# Patient Record
Sex: Male | Born: 1960 | Race: Black or African American | Hispanic: No | Marital: Married | Smoking: Former smoker
Health system: Southern US, Community
[De-identification: ages and names within clinical notes are randomized; demographics above are authoritative.]

## PROBLEM LIST (undated history)

## (undated) DIAGNOSIS — F1010 Alcohol abuse, uncomplicated: Secondary | ICD-10-CM

## (undated) DIAGNOSIS — E785 Hyperlipidemia, unspecified: Secondary | ICD-10-CM

## (undated) DIAGNOSIS — E119 Type 2 diabetes mellitus without complications: Secondary | ICD-10-CM

## (undated) DIAGNOSIS — I10 Essential (primary) hypertension: Secondary | ICD-10-CM

## (undated) HISTORY — DX: Essential (primary) hypertension: I10

## (undated) HISTORY — DX: Type 2 diabetes mellitus without complications: E11.9

## (undated) HISTORY — DX: Hyperlipidemia, unspecified: E78.5

## (undated) HISTORY — DX: Alcohol abuse, uncomplicated: F10.10

---

## 2005-04-09 ENCOUNTER — Encounter: Admission: RE | Admit: 2005-04-09 | Discharge: 2005-04-09 | Payer: Self-pay | Admitting: Occupational Medicine

## 2006-11-29 ENCOUNTER — Ambulatory Visit: Payer: Self-pay | Admitting: Cardiology

## 2006-11-29 ENCOUNTER — Ambulatory Visit (HOSPITAL_COMMUNITY): Admission: EM | Admit: 2006-11-29 | Discharge: 2006-11-30 | Payer: Self-pay | Admitting: Emergency Medicine

## 2006-11-30 ENCOUNTER — Encounter: Payer: Self-pay | Admitting: Cardiology

## 2009-09-12 ENCOUNTER — Encounter: Admission: RE | Admit: 2009-09-12 | Discharge: 2009-09-12 | Payer: Self-pay | Admitting: Internal Medicine

## 2010-09-01 IMAGING — CR DG KNEE COMPLETE 4+V*L*
5 series · 5 of 5 positions shown · non-contrast
Comparison: None

CLINICAL DATA: Injury

LEFT KNEE - COMPLETE 4+ VIEW

[view not recorded (1 of 5)]
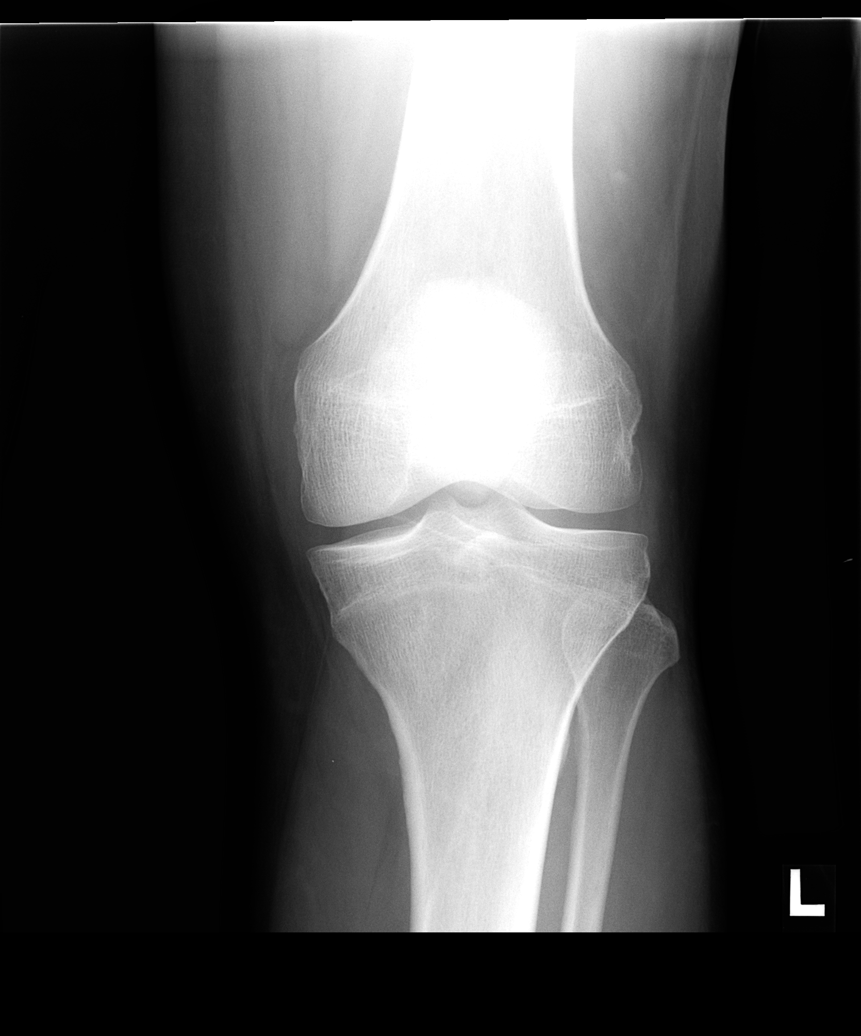

[view not recorded (2 of 5)]
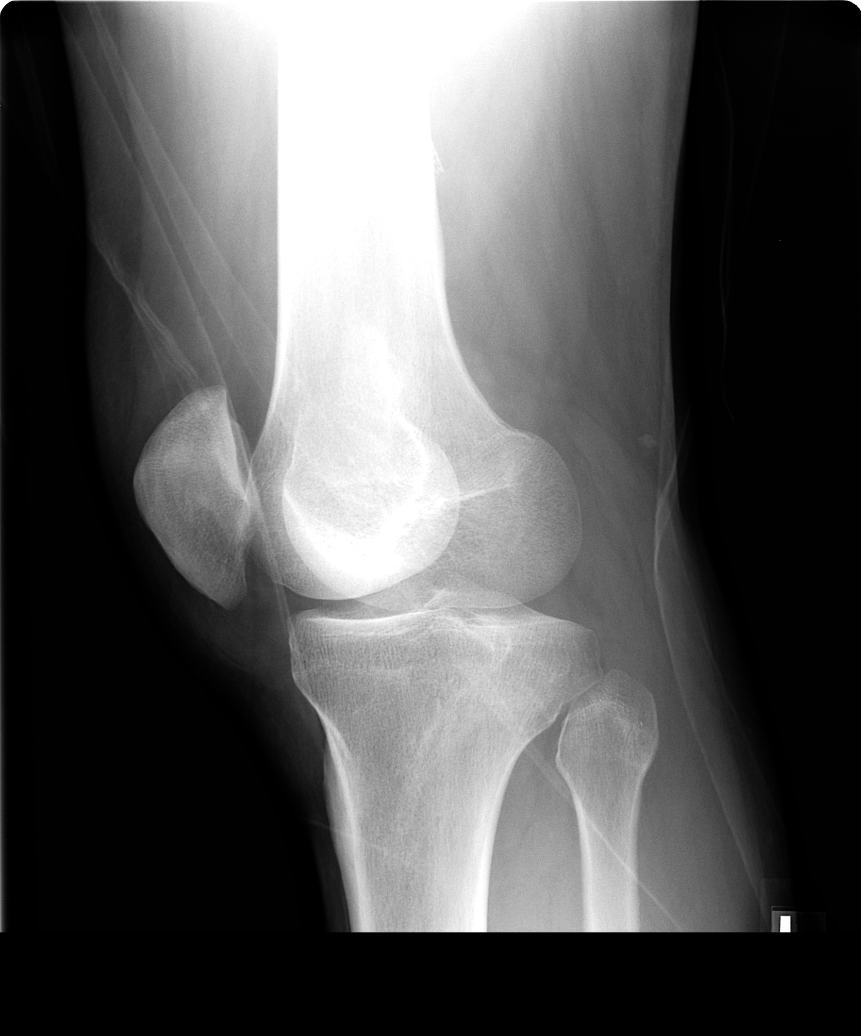

[view not recorded (3 of 5)]
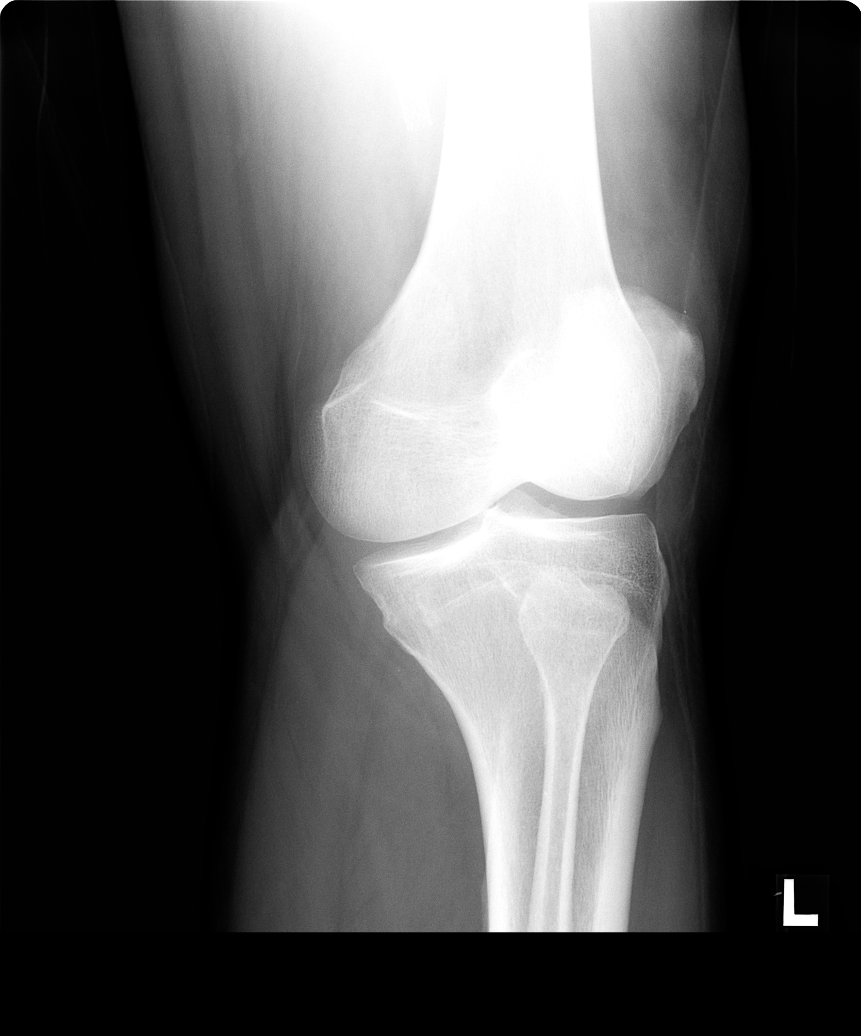

[view not recorded (4 of 5)]
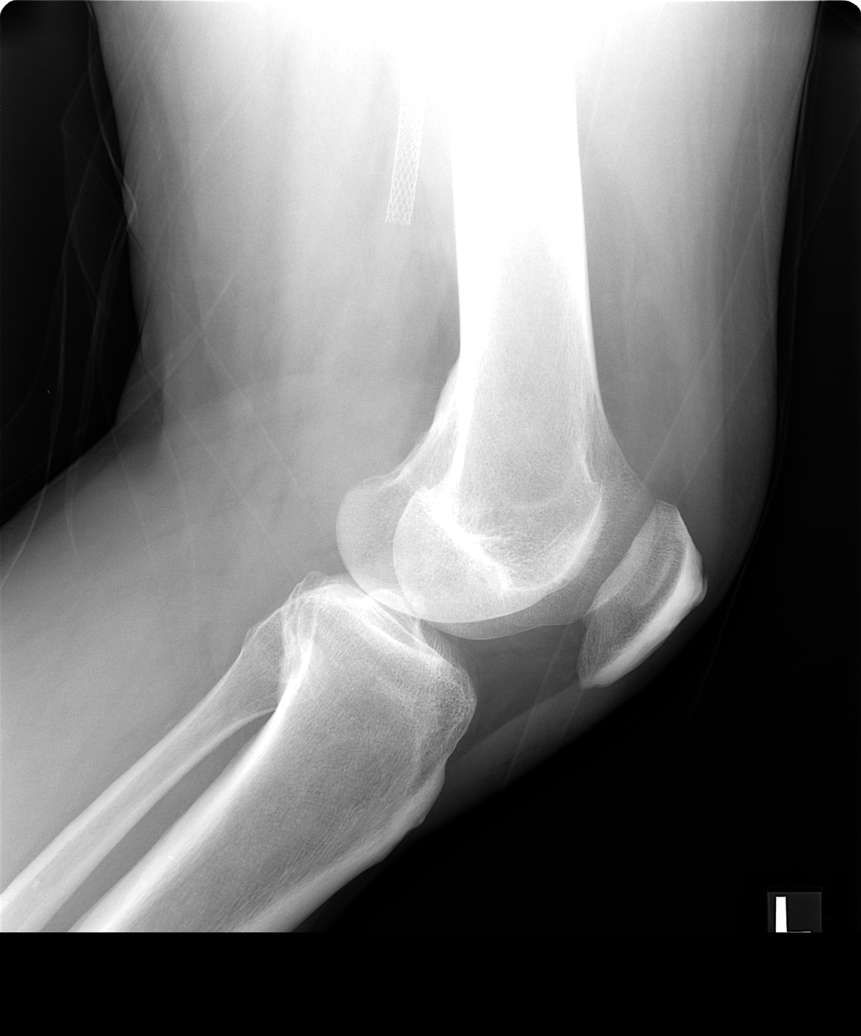

[view not recorded (5 of 5)]
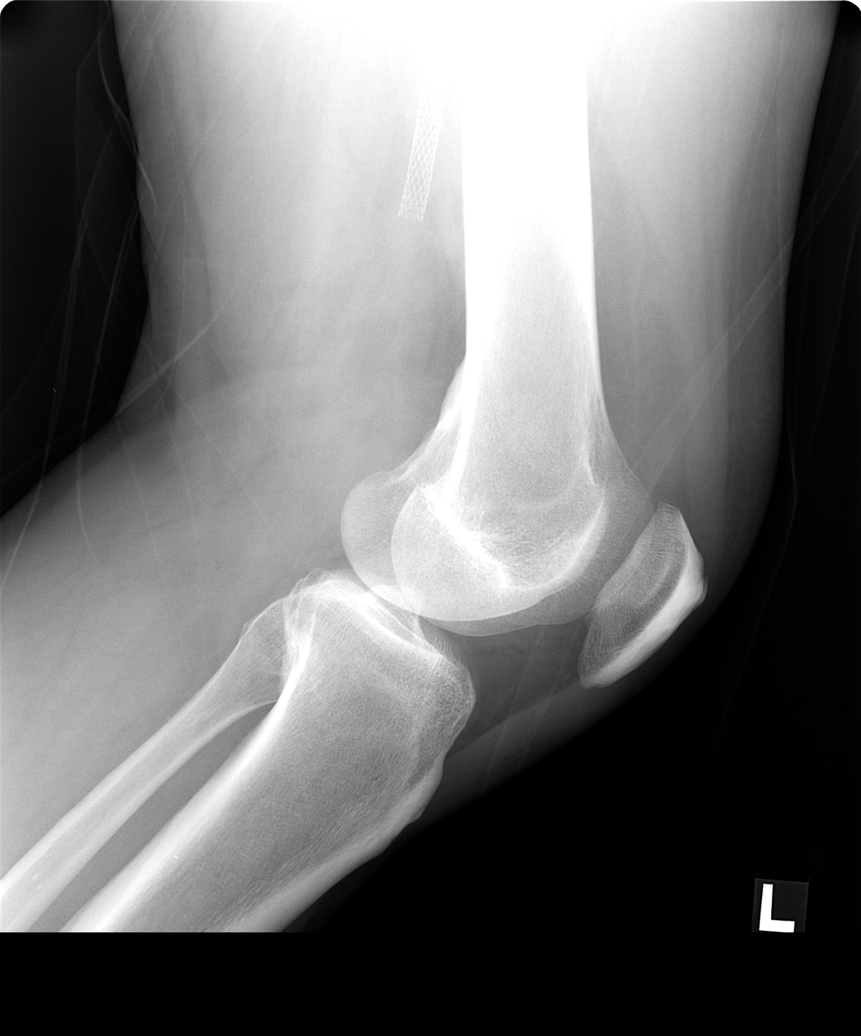

[5 of 5 positions shown; findings below may reference images not displayed]

FINDINGS: No acute fracture.  No dislocation.  Minimal degenerative
change.  Popliteal artery stent.
IMPRESSION: No acute bony pathology.

## 2011-03-02 NOTE — Discharge Summary (Signed)
Carlos Delgado, BAGOT             ACCOUNT NO.:  [id number]   MEDICAL RECORD NO.:  [id number]          PATIENT TYPE:  OIB   LOCATION:  3729                         FACILITY:  [facility]   PHYSICIAN:  Dorian Pod, ACNP  DATE OF BIRTH:  11/19/1960   DATE OF ADMISSION:  11/29/2006  DATE OF DISCHARGE:  11/30/2006                               DISCHARGE SUMMARY   DISCHARGING PHYSICIAN:  Luis Abed, MD, [facility].   DISCHARGING DIAGNOSIS:  1. Chest pain, most likely etiology pericarditis.  The patient is past      post 2D echocardiogram this admission shows a normal EF of 65-70%.      No left ventricular regional wall motion abnormalities.  Normal      mitral valve.  Atrial valve thickness mildly increased.  No      pericardial effusion.  Cardiac markers:  Troponin was in normal      limits however, CK total elevated at 396.  2. Hypertension.  3. Hyperlipidemia, new diagnosis.  4. Total cholesterol 173, triglycerides 249, LDL 85, HDL 38.  5. Tobacco use.   HOSPITAL COURSE:  Mr. Aigner is pleasant 50 year old African American  gentleman with no prior history of coronary artery disease.  Risk  factors:  Hypertension, tobacco, age, sex.  The patient was in his usual  state of health until day of admission, while getting ready for work,  began having some left chest squeezing sensation that lasted 1-2 seconds  and decreased spontaneously however, became more frequent while at work.  Worse with inspiration and deep breath.  The patient went to Urgent Care  where EKG showed diffuse 1 mm ST elevations which were felt to be  significant for early repolarization versus pericarditis when patient  arrived at [facility].  The patient was admitted for observation.  Cycle  cardiac enzymes, troponins, negative x3, however with elevated CK total.  Dr. Myrtis Ser in to see patient on day of discharge.  Heart rate 53, blood  pressure 124/69.  The patient is being discharged home with instructions  to followup  with Dr. Excell Seltzer for reevaluation.   MEDICATIONS AT TIME OF DISCHARGE:  1. Patient's previous blood pressure medication which he had stopped      himself.  2. Lisinopril/hydrochlorothiazide 20/12.5 mg daily.  3. Aspirin 81 mg daily.  4. Simvastatin 40 mg daily are new.  5. Ibuprofen 600 mg q.6 h for the next 4 days.   He can return to work on Tuesday, December 02, 2006.  He needs to  followup with Dr. Excell Seltzer within the next 2 weeks.  The patient agrees to  call our office to schedule an appointment, otherwise increase activity  as tolerated.  Seek medical assistance if he has developing fever,  chills, return of chest pain or increased shortness of breath.      Dorian Pod, ACNP     MB/MEDQ  D:  11/30/2006  T:  12/01/2006  Job:  [id number]   cc:   Zollie Pee, MD  [place] Cleta Alberts, M.D.

## 2011-03-02 NOTE — H&P (Signed)
NAMEBANNER, HUCKABA             ACCOUNT NO.:  [id number]   MEDICAL RECORD NO.:  [id number]          PATIENT TYPE:  OIB   LOCATION:  3729                         FACILITY:  [facility]   PHYSICIAN:  Gerrit Friends. Dietrich Pates, MD, FACCDATE OF BIRTH:  06-27-1961   DATE OF ADMISSION:  11/29/2006  DATE OF DISCHARGE:  11/30/2006                              HISTORY & PHYSICAL   PRIMARY CARE PHYSICIAN:  Dr. Zollie Pee in [place]   REFERRING PHYSICIAN:  Dr. Cleta Alberts at urgent care in [place].  He is new  to [facility].   PATIENT PROFILE:  A 50 year old, married African-American male without  prior history of coronary artery disease who presents to the ED with  chest pain.   PROBLEM LIST:  1. Chest pain.  2. Hypertension.  3. Untreated hyperlipidemia.  4. Ongoing tobacco abuse.   HISTORY OF PRESENT ILLNESS:  This is a 50 year old, married African-  American male without prior history of CAD.  He does have a history of  hypertension, tobacco abuse and non-treated hyperlipidemia.  He was in  his usual state of health until this morning at approximately 5:30 a.m.  when getting ready for work, he developed 6/10 left chest squeezing pain  without radiation or associated symptoms lasting one to two seconds then  resolving spontaneously.  The symptoms were more frequent at work with  exertion, but were also noted to worsen with deep breathing and again  lasted only one or two seconds.  At work, symptoms would occur as much  as 15 times an hour, thus, prompting him to call his primary care  physician in [facility] who recommended that he presented to the ED.  Instead, he went to a local urgent care and was seen by Dr. Cleta Alberts.  ECG  there revealed 0.5 to 1 mm ST-segment elevation in leads 1, 2, AVF and  V3-V6 and he was subsequently sent along to the [facility] ED for  further evaluation.  Here, he is currently pain free at rest, however he  does have reproducible left chest pain with deep  breathing.  He  continues to have ECG changes as described above.   ALLERGIES:  No known drug allergies.   HOME MEDICATIONS:  Lisinopril/HCTZ 20/12.5 mg q.d.  He was previously on  a statin which he self-discontinued.   FAMILY HISTORY:  Mother died of pneumonia and alcoholism at age 53,  father is age 77 and alive and well, he has had two sisters and one  brother who are alive and well.   SOCIAL HISTORY:  He lives in [place] with his wife and one of his  children, he also has four step-children who are grown.  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  He  smoked about 3/4 of a pack a day for the past 25 years and continues to  smoke that amount.  He previously was a heavy alcohol user, but quit six  years ago.  He also previously used crack, marijuana, heroin and also  injected heroin on one occasion.  He quit  all drugs six years ago.   REVIEW OF SYSTEMS:  Positive for pleuritic chest pain as described in  the HPI, otherwise all systems reviewed and are negative.   PHYSICAL EXAMINATION:  VITAL SIGNS:  Temperature 97.6, heart rate 56,  respirations 16, blood pressure 138/74, pulse ox 99% on room air, there  was no pulsus paradoxus.  GENERAL:  Pleasant African-American male in no acute distress, awake,  alert and oriented x3.  NECK:  No bruits or JVD.  LUNGS:  Respirations are regular and unlabored with diminished breath  sounds at the bases and otherwise clear to auscultation.  CARDIAC:  Regular S1-S2, no S3-S4, murmurs or rubs.  ABDOMEN:  Round, soft, nontender, nondistended.  Bowel sounds are  present x4.  EXTREMITIES:  Warm and dry.  No clubbing, cyanosis or edema.  Dorsalis  pedis, posterior tibial pulses 2+ and equal bilaterally.   Chest x-ray is pending.   EKG shows sinus rhythm with a normal axis, rate of 56 beats per minute  and 0.5 to 1 mm ST-segment elevation with J-point elevation in leads 1,  2, AVF and V3-V6.   CK-MB  is 1.9, troponin I is less than 0.05.  Other lab work is pending.   ASSESSMENT/PLAN:  1. Chest pain.  He has both typical (exertional) and atypical      (pleuritic, brief lasting one to two seconds) symptoms.  He is now      pain free except for when he takes a deep breath which reproduces      his pain exactly.  ECG reveals 0.5 to 1 mm fairly diffuse ST-      segment elevation as described above, question early repolarization      versus pericarditis given the pleur tic nature of his discomfort.      I will plan to  observe him tonight, will cycle his cardiac      markers, check 2-D echo to evaluate the ejection fraction, wall      motion and his pericardium.  Provided that enzymes and echo are      normal, probable discharge in the morning.  Giving the atypical      nature of his symptoms, he likely does not require further ischemic      evaluation.  We will check a D-dimer.  2. Hypertension.  Stable, continue home medication.  3. Hyperlipidemia.  This is currently untreated.  He was previously on      a statin, but however came off of it apparently because of cost.      He said his primary care doctor was going to write for a generic      available at [facility], but he has not followed up with him      recently.  We will go ahead and add a statin and check lipids and      LFTs.  4. Tobacco abuse.  Smoking cessation strongly advised.  We will ask      the cessation team to see him.      Nicolasa Ducking, ANP      Gerrit Friends. Dietrich Pates, MD, [facility]  Electronically Signed    CB/MEDQ  D:  11/29/2006  T:  11/30/2006  Job:  [id number]

## 2012-12-31 ENCOUNTER — Encounter: Payer: Self-pay | Admitting: Family Medicine

## 2012-12-31 ENCOUNTER — Ambulatory Visit (INDEPENDENT_AMBULATORY_CARE_PROVIDER_SITE_OTHER): Payer: BC Managed Care – PPO | Admitting: Family Medicine

## 2012-12-31 VITALS — BP 132/79 | HR 68 | Ht 68.5 in | Wt 177.0 lb

## 2012-12-31 DIAGNOSIS — I10 Essential (primary) hypertension: Secondary | ICD-10-CM | POA: Insufficient documentation

## 2012-12-31 DIAGNOSIS — E119 Type 2 diabetes mellitus without complications: Secondary | ICD-10-CM | POA: Insufficient documentation

## 2012-12-31 DIAGNOSIS — I739 Peripheral vascular disease, unspecified: Secondary | ICD-10-CM | POA: Insufficient documentation

## 2012-12-31 DIAGNOSIS — E785 Hyperlipidemia, unspecified: Secondary | ICD-10-CM | POA: Insufficient documentation

## 2012-12-31 DIAGNOSIS — K047 Periapical abscess without sinus: Secondary | ICD-10-CM

## 2012-12-31 MED ORDER — AMOXICILLIN-POT CLAVULANATE 500-125 MG PO TABS: ORAL_TABLET | ORAL | Status: AC

## 2012-12-31 MED ORDER — HYDROCODONE-ACETAMINOPHEN 5-325 MG PO TABS: ORAL_TABLET | ORAL | Status: AC
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
## 2013-02-26 ENCOUNTER — Encounter: Payer: Self-pay | Admitting: Physician Assistant

## 2013-02-26 DIAGNOSIS — K219 Gastro-esophageal reflux disease without esophagitis: Secondary | ICD-10-CM | POA: Insufficient documentation

## 2016-01-23 DIAGNOSIS — F99 Mental disorder, not otherwise specified: Secondary | ICD-10-CM

## 2016-01-23 NOTE — Congregational Nurse Program (Signed)
Congregational Nurse Program Note  Date of Encounter: 01/23/2016  Past Medical History: Past Medical History  Diagnosis Date  . Hypertension   . Diabetes   . Hyperlipidemia   . Alcohol abuse     Encounter Details:     CNP Questionnaire - 01/23/16 1714    Patient Demographics   Is this a new or existing patient? Existing   Patient is considered a/an Not Applicable   Race African-American/Black   Patient Assistance   Location of Patient Assistance Not Applicable   Patient's financial/insurance status Self-Pay;Private Insurance Coverage   Uninsured Patient No   Food insecurities addressed Not Applicable   Transportation assistance No   Assistance securing medications No   Educational health offerings Behavioral health;Medications   Encounter Details   Primary purpose of visit Education/Health Concerns   Was an Emergency Department visit averted? No   Does patient have a medical provider? Yes   Patient referred to Follow up with established PCP   Was a mental health screening completed? (GAINS tool) No   Does patient have dental issues? No   Does patient have vision issues? No   Since previous encounter, have you referred patient for abnormal blood pressure that resulted in a new diagnosis or medication change? No   Since previous encounter, have you referred patient for abnormal blood glucose that resulted in a new diagnosis or medication change? No   For Abstraction Use Only   Does patient have insurance? Yes       S- CN met with Carlos Delgado at [facility] today  O - Carlos Delgado Had some concerns about his increase in mental health medications  A- Carlos Delgado reported an increase in his medication and is not sure he really needs it at this time .\ P- CN encouraged Carlos. Delgado to speak with his psychiatrist about his concerns and report any side affects .

## 2016-01-25 DIAGNOSIS — Z0130 Encounter for examination of blood pressure without abnormal findings: Secondary | ICD-10-CM

## 2016-01-25 NOTE — Congregational Nurse Program (Signed)
Congregational Nurse Program Note  Date of Encounter: 01/25/2016  Past Medical History: Past Medical History  Diagnosis Date  . Hypertension   . Diabetes   . Hyperlipidemia   . Alcohol abuse     Encounter Details:     CNP Questionnaire - 01/25/16 1944    Patient Demographics   Is this a new or existing patient? Existing   Patient is considered a/an Not Applicable   Race African-American/Black   Patient Assistance   Location of Patient Assistance Not Applicable   Patient's financial/insurance status Private Insurance Coverage   Uninsured Patient No   Patient referred to apply for the following financial assistance Not Applicable   Food insecurities addressed Not Applicable   Transportation assistance No   Assistance securing medications No   Educational health offerings Hypertension   Encounter Details   Primary purpose of visit Education/Health Concerns   Was an Emergency Department visit averted? No   Does patient have a medical provider? Yes   Patient referred to Follow up with established PCP   Was a mental health screening completed? (GAINS tool) No   Does patient have dental issues? No   Does patient have vision issues? No   Since previous encounter, have you referred patient for abnormal blood pressure that resulted in a new diagnosis or medication change? No   Since previous encounter, have you referred patient for abnormal blood glucose that resulted in a new diagnosis or medication change? No   For Abstraction Use Only   Does patient have insurance? No       S- Carlos Delgado [facility] blood [facility]  O-Blood pressure reading 141/70 A- Carlos Delgado has a normal blood pressure reading  P Carlos Delgado asked about what was a normal blood pressure reading 120/70,explained that a blood pressure under 140/90 is usually normal depending on the persons diet and activity when taken

## 2016-04-21 DIAGNOSIS — R079 Chest pain, unspecified: Secondary | ICD-10-CM

## 2016-04-21 NOTE — Congregational Nurse Program (Unsigned)
Congregational Nurse Program Note  Date of Encounter: 04/21/2016  Past Medical History: Past Medical History  Diagnosis Date  . Hypertension   . Diabetes   . Hyperlipidemia   . Alcohol abuse     Encounter Details:     CNP Questionnaire - 04/21/16 1239    Patient Demographics   Is this a new or existing patient? Existing   Patient is considered a/an Not Applicable   Race African-American/Black   Patient Assistance   Location of Patient Assistance Not Applicable   Patient's financial/insurance status Private Insurance Coverage   Uninsured Patient No   Patient referred to apply for the following financial assistance Not Applicable   Food insecurities addressed Not Applicable   Transportation assistance No   Assistance securing medications No   Educational health offerings Not Applicable   Encounter Details   Primary purpose of visit Post ED/Hospitalization Visit   Was an Emergency Department visit averted? No   Does patient have a medical provider? Yes   Patient referred to Follow up with established PCP   Was a mental health screening completed? (GAINS tool) No   Does patient have dental issues? No   Does patient have vision issues? No   Does your patient have an abnormal blood pressure today? No   Does your patient have an abnormal blood glucose today? No   Since previous encounter, have you referred patient for abnormal blood glucose that resulted in a new diagnosis or medication change? No   Was there a life-saving intervention made? No       CN note  S- Mr Carlos Delgado was seen at [facility] by the CN O- Mr .Carlos Delgado was discharged from the hospital after 24hrs  being admitted for chest pain  A- Mr Carlos Delgado was pain free today  P- Mr Carlos Delgado has an appointment for follow up with a cardiologist

## 2017-02-03 ENCOUNTER — Emergency Department (HOSPITAL_COMMUNITY)
Admission: EM | Admit: 2017-02-03 | Discharge: 2017-02-03 | Disposition: A | Discharge status: Home / Self Care | Payer: Self-pay | Attending: Dermatology | Admitting: Dermatology

## 2017-02-03 ENCOUNTER — Encounter (HOSPITAL_COMMUNITY): Payer: Self-pay | Admitting: Emergency Medicine

## 2017-02-03 DIAGNOSIS — Z5321 Procedure and treatment not carried out due to patient leaving prior to being seen by health care provider: Secondary | ICD-10-CM | POA: Insufficient documentation

## 2017-02-03 DIAGNOSIS — R1110 Vomiting, unspecified: Secondary | ICD-10-CM | POA: Insufficient documentation

## 2017-02-03 MED ORDER — ONDANSETRON 4 MG PO TBDP
4.0000 mg | ORAL_TABLET | Freq: Once | ORAL | Status: AC | PRN
  Administered 2017-02-03: 4 mg via ORAL
  Filled 2017-02-03: qty 1

## 2017-02-03 NOTE — ED Triage Notes (Signed)
Patient states that he was seen here on Friday for same vomiting and left here yesterday feeling better then last night vomiting started again.  Patient c/o abd pain but denies diarrhea.

## 2021-12-03 ENCOUNTER — Emergency Department (HOSPITAL_COMMUNITY)
Admission: EM | Admit: 2021-12-03 | Discharge: 2021-12-03 | Disposition: A | Discharge status: Home / Self Care | Payer: BC Managed Care – PPO | Attending: Emergency Medicine | Admitting: Emergency Medicine

## 2021-12-03 ENCOUNTER — Other Ambulatory Visit: Payer: Self-pay

## 2021-12-03 ENCOUNTER — Encounter (HOSPITAL_COMMUNITY): Payer: Self-pay

## 2021-12-03 DIAGNOSIS — R42 Dizziness and giddiness: Secondary | ICD-10-CM | POA: Insufficient documentation

## 2021-12-03 DIAGNOSIS — Z7982 Long term (current) use of aspirin: Secondary | ICD-10-CM | POA: Insufficient documentation

## 2021-12-03 DIAGNOSIS — E876 Hypokalemia: Secondary | ICD-10-CM | POA: Diagnosis not present

## 2021-12-03 DIAGNOSIS — I10 Essential (primary) hypertension: Secondary | ICD-10-CM

## 2021-12-03 DIAGNOSIS — Z7901 Long term (current) use of anticoagulants: Secondary | ICD-10-CM | POA: Diagnosis not present

## 2021-12-03 LAB — CBC WITH DIFFERENTIAL/PLATELET
Abs Immature Granulocytes: 0.02 10*3/uL (ref 0.00–0.07)
Basophils Absolute: 0 10*3/uL (ref 0.0–0.1)
Basophils Relative: 0 %
Eosinophils Absolute: 0.2 10*3/uL (ref 0.0–0.5)
Eosinophils Relative: 2 %
HCT: 40.6 % (ref 39.0–52.0)
Hemoglobin: 14.1 g/dL (ref 13.0–17.0)
Immature Granulocytes: 0 %
Lymphocytes Relative: 25 %
Lymphs Abs: 1.8 10*3/uL (ref 0.7–4.0)
MCH: 28.2 pg (ref 26.0–34.0)
MCHC: 34.7 g/dL (ref 30.0–36.0)
MCV: 81.2 fL (ref 80.0–100.0)
Monocytes Absolute: 0.7 10*3/uL (ref 0.1–1.0)
Monocytes Relative: 10 %
Neutro Abs: 4.4 10*3/uL (ref 1.7–7.7)
Neutrophils Relative %: 63 %
Platelets: 248 10*3/uL (ref 150–400)
RBC: 5 MIL/uL (ref 4.22–5.81)
RDW: 13.8 % (ref 11.5–15.5)
WBC: 7.1 10*3/uL (ref 4.0–10.5)
nRBC: 0 % (ref 0.0–0.2)

## 2021-12-03 LAB — BASIC METABOLIC PANEL
Anion gap: 7 (ref 5–15)
BUN: 14 mg/dL (ref 8–23)
CO2: 27 mmol/L (ref 22–32)
Calcium: 8.9 mg/dL (ref 8.9–10.3)
Chloride: 102 mmol/L (ref 98–111)
Creatinine, Ser: 1.42 mg/dL — ABNORMAL HIGH (ref 0.61–1.24)
GFR, Estimated: 56 mL/min — ABNORMAL LOW (ref >60)
Glucose, Bld: 121 mg/dL — ABNORMAL HIGH (ref 70–99)
Potassium: 3.2 mmol/L — ABNORMAL LOW (ref 3.5–5.1)
Sodium: 136 mmol/L (ref 135–145)

## 2021-12-03 MED ORDER — POTASSIUM CHLORIDE CRYS ER 20 MEQ PO TBCR
40.0000 meq | EXTENDED_RELEASE_TABLET | Freq: Once | ORAL | Status: AC
  Administered 2021-12-03: 40 meq via ORAL
  Filled 2021-12-03: qty 2

## 2021-12-03 NOTE — ED Triage Notes (Signed)
Patient reports that his BP at home was 213/97. Patient states he had not been taking his BP meds as directed until he went to his physician last week. Patient states his BP has stayed high for the week. Patient states dizziness started this AM.

## 2021-12-03 NOTE — ED Provider Notes (Signed)
[facility]-EMERGENCY DEPT Provider Note   CSN: [id number] Arrival date & time: 12/03/21  0908     History  Chief Complaint  Patient presents with   Hypertension   Dizziness    Carlos Delgado is a 61 y.o. male.  61 year old male with prior medical history detailed below presents for evaluation.  Patient with known history of hypertension.  Patient reports compliance with previously prescribed antihypertensive.  Patient reports that over the last week he has noticed his blood pressure has been elevated at home.  Home blood pressure reads of been as high as 200/100.  Patient denies chest pain or shortness of breath.  Patient is concerned that his blood pressure is remaining elevated despite taking his home medications.  Patient without other acute complaint.  The history is provided by the patient and medical records.  Hypertension This is a chronic problem. The current episode started more than 1 week ago. The problem occurs daily. The problem has not changed since onset.Pertinent negatives include no chest pain and no abdominal pain. Nothing aggravates the symptoms. Nothing relieves the symptoms.  Dizziness Associated symptoms: no chest pain       Home Medications Prior to Admission medications   Medication Sig Start Date End Date Taking? Authorizing Provider  aspirin 81 MG tablet Take 81 mg by mouth daily.   Yes [provider]  cilostazol (PLETAL) 50 MG tablet Take 100 mg by mouth 2 (two) times daily. 11/17/21  Yes [provider]  clopidogrel (PLAVIX) 75 MG tablet Take 75 mg by mouth daily. 05/10/15  Yes [provider]  gabapentin (NEURONTIN) 300 MG capsule Take 600 mg by mouth in the morning and at bedtime. 05/11/15  Yes [provider]  lisinopril-hydrochlorothiazide (ZESTORETIC) 20-25 MG tablet Take 1 tablet by mouth daily. 09/22/21  Yes [provider]  Multiple Vitamin (MULTIVITAMIN) tablet Take 1 tablet by  mouth daily.   Yes [provider]  HYDROcodone-acetaminophen (NORCO/VICODIN) 5-325 MG per tablet One to two tablets every eight hours only as needed for severe tooth pain. Patient not taking: Reported on 12/03/2021 12/31/12   Laren Boom, DO      Allergies    Fluoxetine and Simvastatin    Review of Systems   Review of Systems  Cardiovascular:  Negative for chest pain.  Gastrointestinal:  Negative for abdominal pain.  Neurological:  Positive for dizziness.  All other systems reviewed and are negative.  Physical Exam Updated Vital Signs BP (!) 165/95    Pulse (!) 54    Temp (!) 97.5 F (36.4 C) (Oral)    Resp 15    Ht 5\' 8"  (1.727 m)    Wt 90.7 kg    SpO2 96%    BMI 30.41 kg/m  Physical Exam Vitals and nursing note reviewed.  Constitutional:      General: He is not in acute distress.    Appearance: Normal appearance. He is well-developed.  HENT:     Head: Normocephalic and atraumatic.  Eyes:     Conjunctiva/sclera: Conjunctivae normal.     Pupils: Pupils are equal, round, and reactive to light.  Cardiovascular:     Rate and Rhythm: Normal rate and regular rhythm.     Heart sounds: Normal heart sounds.  Pulmonary:     Effort: Pulmonary effort is normal. No respiratory distress.     Breath sounds: Normal breath sounds.  Abdominal:     General: There is no distension.     Palpations: Abdomen is  soft.     Tenderness: There is no abdominal tenderness.  Musculoskeletal:        General: No deformity. Normal range of motion.     Cervical back: Normal range of motion and neck supple.  Skin:    General: Skin is warm and dry.  Neurological:     General: No focal deficit present.     Mental Status: He is alert and oriented to person, place, and time. Mental status is at baseline.    ED Results / Procedures / Treatments   Labs (all labs ordered are listed, but only abnormal results are displayed) Labs Reviewed  BASIC METABOLIC PANEL - Abnormal; Notable for the  following components:      Result Value   Potassium 3.2 (*)    Glucose, Bld 121 (*)    Creatinine, Ser 1.42 (*)    GFR, Estimated 56 (*)    All other components within normal limits  CBC WITH DIFFERENTIAL/PLATELET    EKG EKG Interpretation  Date/Time:  Sunday December 03 2021 09:43:47 EST Ventricular Rate:  61 PR Interval:  140 QRS Duration: 81 QT Interval:  393 QTC Calculation: 396 R Axis:   54 Text Interpretation: Sinus rhythm Confirmed by Kristine Royal [phone]) on 12/03/2021 9:47:57 AM  Radiology No results found.  Procedures Procedures    Medications Ordered in ED Medications  potassium chloride SA (KLOR-CON M) CR tablet 40 mEq (40 mEq Oral Given 12/03/21 1103)    ED Course/ Medical Decision Making/ A&P                           Medical Decision Making Amount and/or Complexity of Data Reviewed Labs: ordered.  Risk Prescription drug management.    Medical Screen Complete  This patient presented to the ED with complaint of hypertension.  This complaint involves an extensive number of treatment options. The initial differential diagnosis includes, but is not limited to, chronic hypertension  This presentation is: Acute, Self-Limited, Previously Undiagnosed, Uncertain Prognosis, Complicated, and Systemic Symptoms  Patient with well-established history of hypertension presents with complaint of elevated blood pressures over the last week.  Patient reportedly has been on his current antihypertensives for quite some time.    Patient without evidence of endorgan damage on exam and work-up today.  Patient does have mildly decreased potassium level.  This was supplemented here in the ED.  Patient does have established PCP in the outpatient setting.  Patient does understand need for close follow-up regarding continued treatment of his hypertension.  Strict return precautions given understood.  Importance of close follow-up is stressed.  Additional history  obtained:  External records from outside sources obtained and reviewed including prior ED visits and prior Inpatient records.    Lab Tests:  I ordered and personally interpreted labs.  The pertinent results include: CBC, BMP    Cardiac Monitoring:  The patient was maintained on a cardiac monitor.  I personally viewed and interpreted the cardiac monitor which showed an underlying rhythm of: NSR  Problem List / ED Course:  Hypertension   Reevaluation:  After the interventions noted above, I reevaluated the patient and found that they have: improved   Disposition:  After consideration of the diagnostic results and the patients response to treatment, I feel that the patent would benefit from close outpatient follow-up.          Final Clinical Impression(s) / ED Diagnoses Final diagnoses:  Hypertension, unspecified type  Hypokalemia  Rx / DC Orders ED Discharge Orders     None         Wynetta Fines, MD 12/03/21 1135

## 2021-12-03 NOTE — Discharge Instructions (Signed)
Return for any problem.  ?
